# Patient Record
Sex: Male | Born: 1990 | Hispanic: No | Marital: Married | Smoking: Never smoker
Health system: Southern US, Community
[De-identification: ages and names within clinical notes are randomized; demographics above are authoritative.]

---

## 2020-03-13 ENCOUNTER — Other Ambulatory Visit: Payer: Self-pay

## 2020-03-13 ENCOUNTER — Encounter: Payer: Self-pay | Admitting: Emergency Medicine

## 2020-03-13 ENCOUNTER — Emergency Department
Admission: EM | Admit: 2020-03-13 | Discharge: 2020-03-13 | Disposition: A | Discharge status: Home / Self Care | Payer: Medicaid Other | Attending: Emergency Medicine | Admitting: Emergency Medicine

## 2020-03-13 ENCOUNTER — Emergency Department: Payer: Medicaid Other

## 2020-03-13 DIAGNOSIS — Z1152 Encounter for screening for COVID-19: Secondary | ICD-10-CM

## 2020-03-13 DIAGNOSIS — R5383 Other fatigue: Secondary | ICD-10-CM | POA: Insufficient documentation

## 2020-03-13 DIAGNOSIS — Z20822 Contact with and (suspected) exposure to covid-19: Secondary | ICD-10-CM | POA: Insufficient documentation

## 2020-03-13 DIAGNOSIS — B349 Viral infection, unspecified: Secondary | ICD-10-CM | POA: Insufficient documentation

## 2020-03-13 DIAGNOSIS — J069 Acute upper respiratory infection, unspecified: Secondary | ICD-10-CM | POA: Insufficient documentation

## 2020-03-13 LAB — BASIC METABOLIC PANEL
Anion gap: 9 (ref 5–15)
BUN: 17 mg/dL (ref 6–20)
CO2: 25 mmol/L (ref 22–32)
Calcium: 9.4 mg/dL (ref 8.9–10.3)
Chloride: 104 mmol/L (ref 98–111)
Creatinine, Ser: 1.03 mg/dL (ref 0.61–1.24)
GFR calc Af Amer: >60 mL/min (ref >60)
GFR calc non Af Amer: >60 mL/min (ref >60)
Glucose, Bld: 102 mg/dL — ABNORMAL HIGH (ref 70–99)
Potassium: 4 mmol/L (ref 3.5–5.1)
Sodium: 138 mmol/L (ref 135–145)

## 2020-03-13 LAB — CBC
HCT: 44 % (ref 39.0–52.0)
Hemoglobin: 14.4 g/dL (ref 13.0–17.0)
MCH: 27.4 pg (ref 26.0–34.0)
MCHC: 32.7 g/dL (ref 30.0–36.0)
MCV: 83.8 fL (ref 80.0–100.0)
Platelets: 262 10*3/uL (ref 150–400)
RBC: 5.25 MIL/uL (ref 4.22–5.81)
RDW: 13.7 % (ref 11.5–15.5)
WBC: 7.7 10*3/uL (ref 4.0–10.5)
nRBC: 0 % (ref 0.0–0.2)

## 2020-03-13 LAB — URINALYSIS, COMPLETE (UACMP) WITH MICROSCOPIC
Bacteria, UA: NONE SEEN
Bilirubin Urine: NEGATIVE
Glucose, UA: NEGATIVE mg/dL
Hgb urine dipstick: NEGATIVE
Ketones, ur: NEGATIVE mg/dL
Leukocytes,Ua: NEGATIVE
Nitrite: NEGATIVE
Protein, ur: NEGATIVE mg/dL
Specific Gravity, Urine: 1.028 (ref 1.005–1.030)
Squamous Epithelial / HPF: NONE SEEN (ref 0–5)
pH: 6 (ref 5.0–8.0)

## 2020-03-13 LAB — SARS CORONAVIRUS 2 BY RT PCR (HOSPITAL ORDER, PERFORMED IN ~~LOC~~ HOSPITAL LAB): SARS Coronavirus 2: NEGATIVE

## 2020-03-13 NOTE — ED Provider Notes (Signed)
[facility] Emergency Department Provider Note  ____________________________________________   First MD Initiated Contact with Patient 03/13/20 1335     (approximate)  I have reviewed the triage vital signs and the nursing notes.   HISTORY  Chief Complaint Weakness and URI   HPI Frank Williams is a 29 y.o. male presents to the ED today with complaint of weakness and fatigue for approximately 1 week.  Patient states he had some rhinorrhea and nasal congestion.  He denies any cough, change in taste or smell, vomiting, diarrhea, body aches or fever.  Patient states that his legs feel weak and he has had some mild dizziness with his nasal congestion.  He denies any known exposure to Covid.  He was tested at a clinic on [facility] and the results of his Covid test were negative.  This is a clinic unknown to this provider.      History reviewed. No pertinent past medical history.  There are no problems to display for this patient.   History reviewed. No pertinent surgical history.  Prior to Admission medications   Not on File    Allergies Patient has no known allergies.  No family history on file.  Social History Social History   Tobacco Use   Smoking status: Never Smoker   Smokeless tobacco: Never Used  Substance Use Topics   Alcohol use: Not on file   Drug use: Not on file    Review of Systems Constitutional: No fever/chills Eyes: No visual changes. ENT: No sore throat.  Positive for rhinorrhea. Cardiovascular: Denies chest pain. Respiratory: Denies shortness of breath.  Negative for cough. Gastrointestinal: No abdominal pain.  No nausea, no vomiting.  No diarrhea.   Genitourinary: Negative for dysuria. Musculoskeletal: Negative for back pain. Skin: Negative for rash. Neurological: Negative for headaches, positive generalized weakness.  Negative for  numbness.  ____________________________________________   PHYSICAL  EXAM:  VITAL SIGNS: ED Triage Vitals  Enc Vitals Group     BP 03/13/20 1012 125/77     Pulse Rate 03/13/20 1012 81     Resp 03/13/20 1012 18     Temp 03/13/20 1012 98.8 F (37.1 C)     Temp Source 03/13/20 1012 Oral     SpO2 03/13/20 1012 95 %     Weight 03/13/20 1009 300 lb (136.1 kg)     Height 03/13/20 1009 6\' 3"  (1.905 m)     Head Circumference --      Peak Flow --      Pain Score 03/13/20 1009 6     Pain Loc --      Pain Edu? --      Excl. in GC? --     Constitutional: Alert and oriented. Well appearing and in no acute distress. Eyes: Conjunctivae are normal.  Head: Atraumatic. Nose: No congestion/rhinnorhea. Neck: No stridor.   Cardiovascular: Normal rate, regular rhythm. Grossly normal heart sounds.  Good peripheral circulation. Respiratory: Normal respiratory effort.  No retractions. Lungs CTAB. Gastrointestinal: Soft and nontender. No distention.   Musculoskeletal: Moves upper and lower extremities they have difficulty.  Normal gait was noted.. Neurologic:  Normal speech and language. No gross focal neurologic deficits are appreciated. No gait instability. Skin:  Skin is warm, dry and intact. No rash noted. Psychiatric: Mood and affect are normal. Speech and behavior are normal.  ____________________________________________   LABS (all labs ordered are listed, but only abnormal results are displayed)  Labs Reviewed  BASIC METABOLIC PANEL - Abnormal;  Notable for the following components:      Result Value   Glucose, Bld 102 (*)    All other components within normal limits  URINALYSIS, COMPLETE (UACMP) WITH MICROSCOPIC - Abnormal; Notable for the following components:   Color, Urine YELLOW (*)    APPearance CLEAR (*)    All other components within normal limits  SARS CORONAVIRUS 2 BY RT PCR (HOSPITAL ORDER, PERFORMED IN [facility])  CBC    RADIOLOGY   Official radiology report(s): DG Chest 2 View  Result Date: 03/13/2020 CLINICAL DATA:   Chest tightness.  Weakness and fatigue 1 week ago. EXAM: CHEST - 2 VIEW COMPARISON:  None. FINDINGS: The heart size and mediastinal contours are within normal limits. Both lungs are clear. The visualized skeletal structures are unremarkable. IMPRESSION: No active cardiopulmonary disease. Electronically Signed   By: Signa Kell M.D.   On: 03/13/2020 10:38    ____________________________________________   PROCEDURES  Procedure(s) performed (including Critical Care):  Procedures   ____________________________________________   INITIAL IMPRESSION / ASSESSMENT AND PLAN / ED COURSE  As part of my medical decision making, I reviewed the following data within the electronic MEDICAL RECORD NUMBER Notes from prior ED visits and [place] Controlled Substance Database  Frank Williams was evaluated in Emergency Department on 03/13/2020 for the symptoms described in the history of present illness. He was evaluated in the context of the global COVID-19 pandemic, which necessitated consideration that the patient might be at risk for infection with the SARS-CoV-2 virus that causes COVID-19. Institutional protocols and algorithms that pertain to the evaluation of patients at risk for COVID-19 are in a state of rapid change based on information released by regulatory bodies including the CDC and federal and state organizations. These policies and algorithms were followed during the patient's care in the ED.  29 year old male presents to the ED with complaint of feeling weak and fatigued for approximately 1 week.  He also has developed some rhinorrhea.  He is unaware of any known exposure to Covid and states that he had a test done at a clinic on [facility] which was negative.  Patient was made aware that his labs and chest x-ray were unremarkable.  Covid test was repeated in the emergency department as we are uncertain as to what type of Covid test was done and patient does have some minor symptoms that has been seen  with Covid.  Patient is aware that until he gets the test results he is to quarantine.  He is encouraged to increase fluids and take Tylenol or ibuprofen as needed.  He is to follow-up with his PCP if any continued problems or return to the emergency department if any severe worsening of his symptoms.  ____________________________________________   FINAL CLINICAL IMPRESSION(S) / ED DIAGNOSES  Final diagnoses:  Viral illness  Encounter for screening for COVID-19     ED Discharge Orders    None       Note:  This document was prepared using Dragon voice recognition software and may include unintentional dictation errors.    Tommi Rumps, PA-C 03/13/20 1506    Sharyn Creamer, MD 03/13/20 1535

## 2020-03-13 NOTE — Discharge Instructions (Signed)
Follow-up with your primary care provider if any continued problems.  Return to the emergency department if any severe worsening of your symptoms such as shortness of breath or difficulty breathing.  A Covid test was done today and can be seen in "my chart".  Increase fluids.  Take ibuprofen or Tylenol as needed for body aches, fever, headache as needed.  If your test is positive you will need to quarantine and also family members will need to be tested if they are having symptoms.

## 2020-03-13 NOTE — ED Triage Notes (Signed)
States he developed weakness and fatigue about 1 week ago  Then has had runny nose

## 2021-10-02 IMAGING — CR DG CHEST 2V
1 series · 2 of 2 positions shown · non-contrast
Comparison: None.

CLINICAL DATA: Chest tightness.  Weakness and fatigue 1 week ago.

EXAM:
CHEST - 2 VIEW

[Series 1: dg chest 2 view · 0.14mm/px · 2 of 2 slices shown]
[im 1/2]
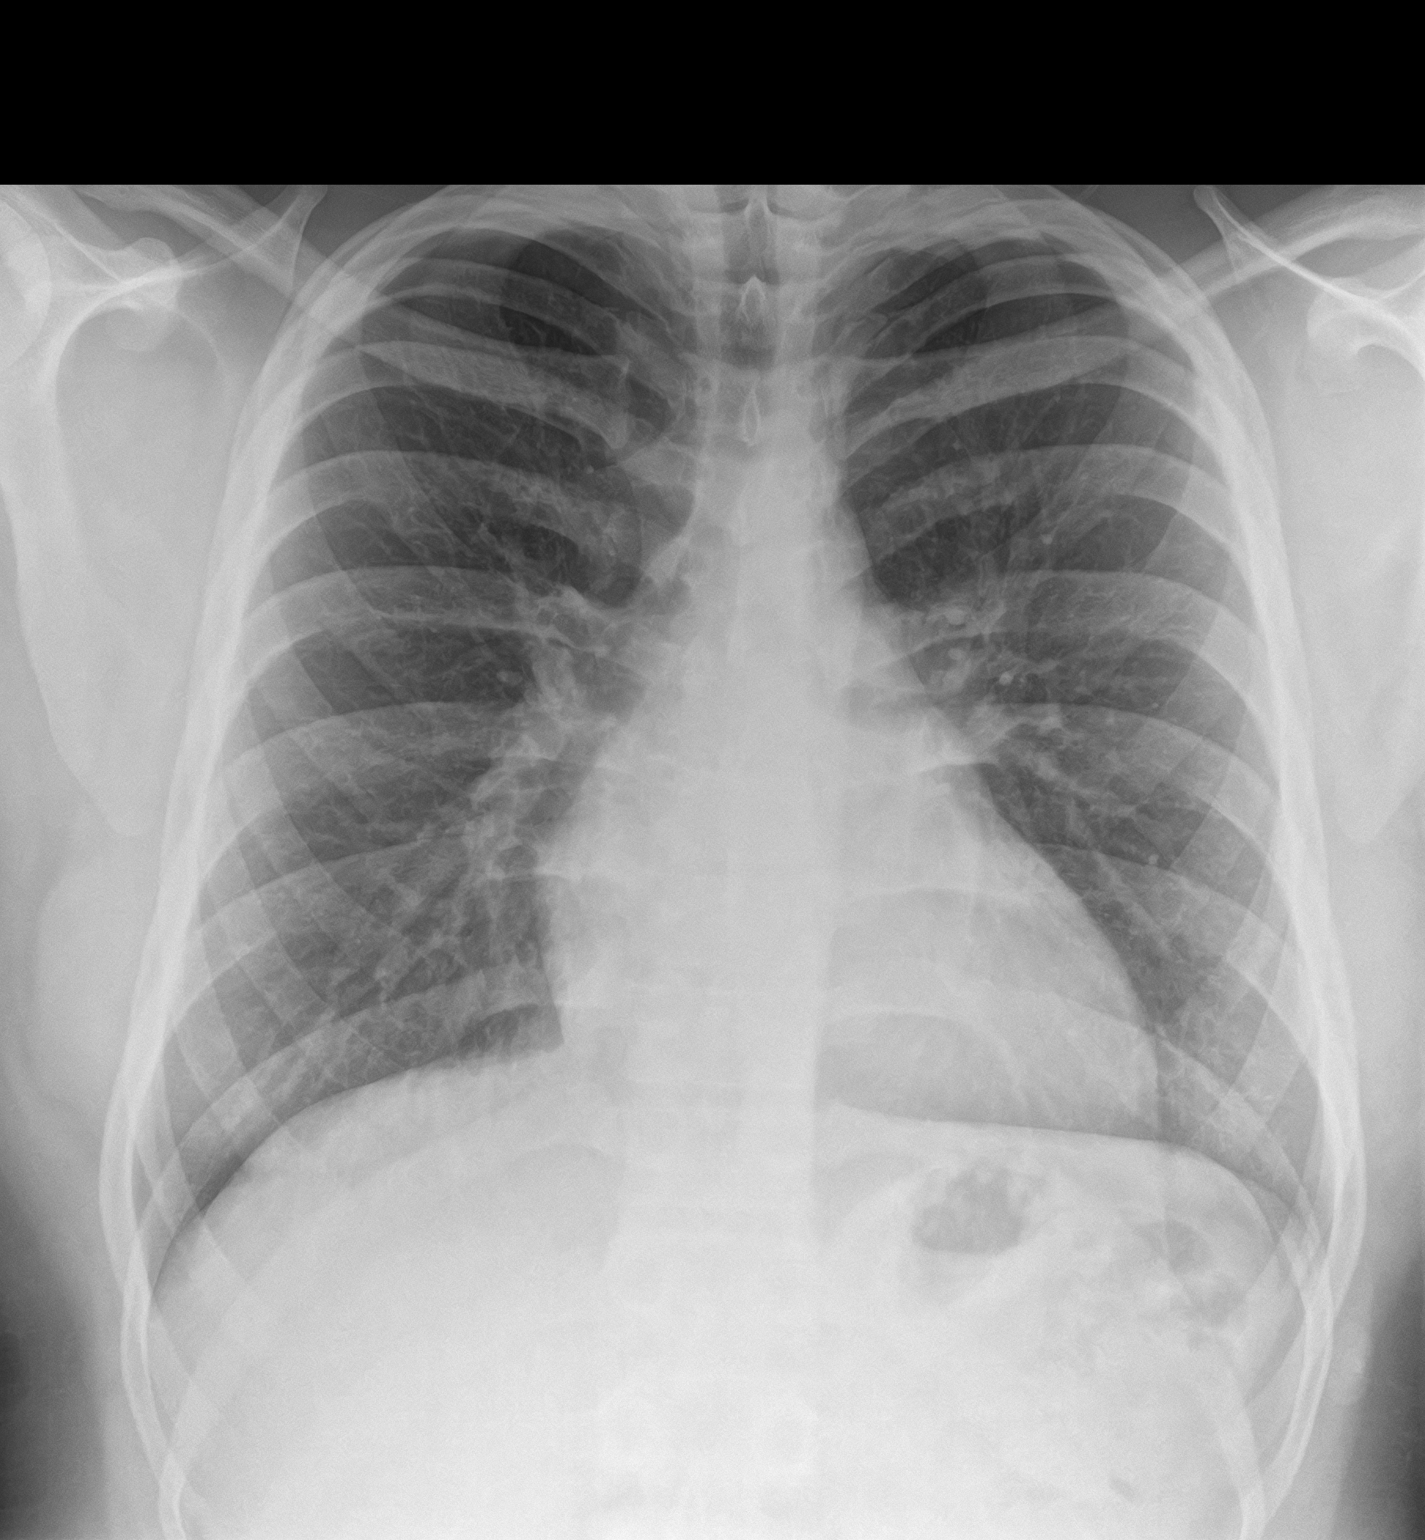
[im 2/2]
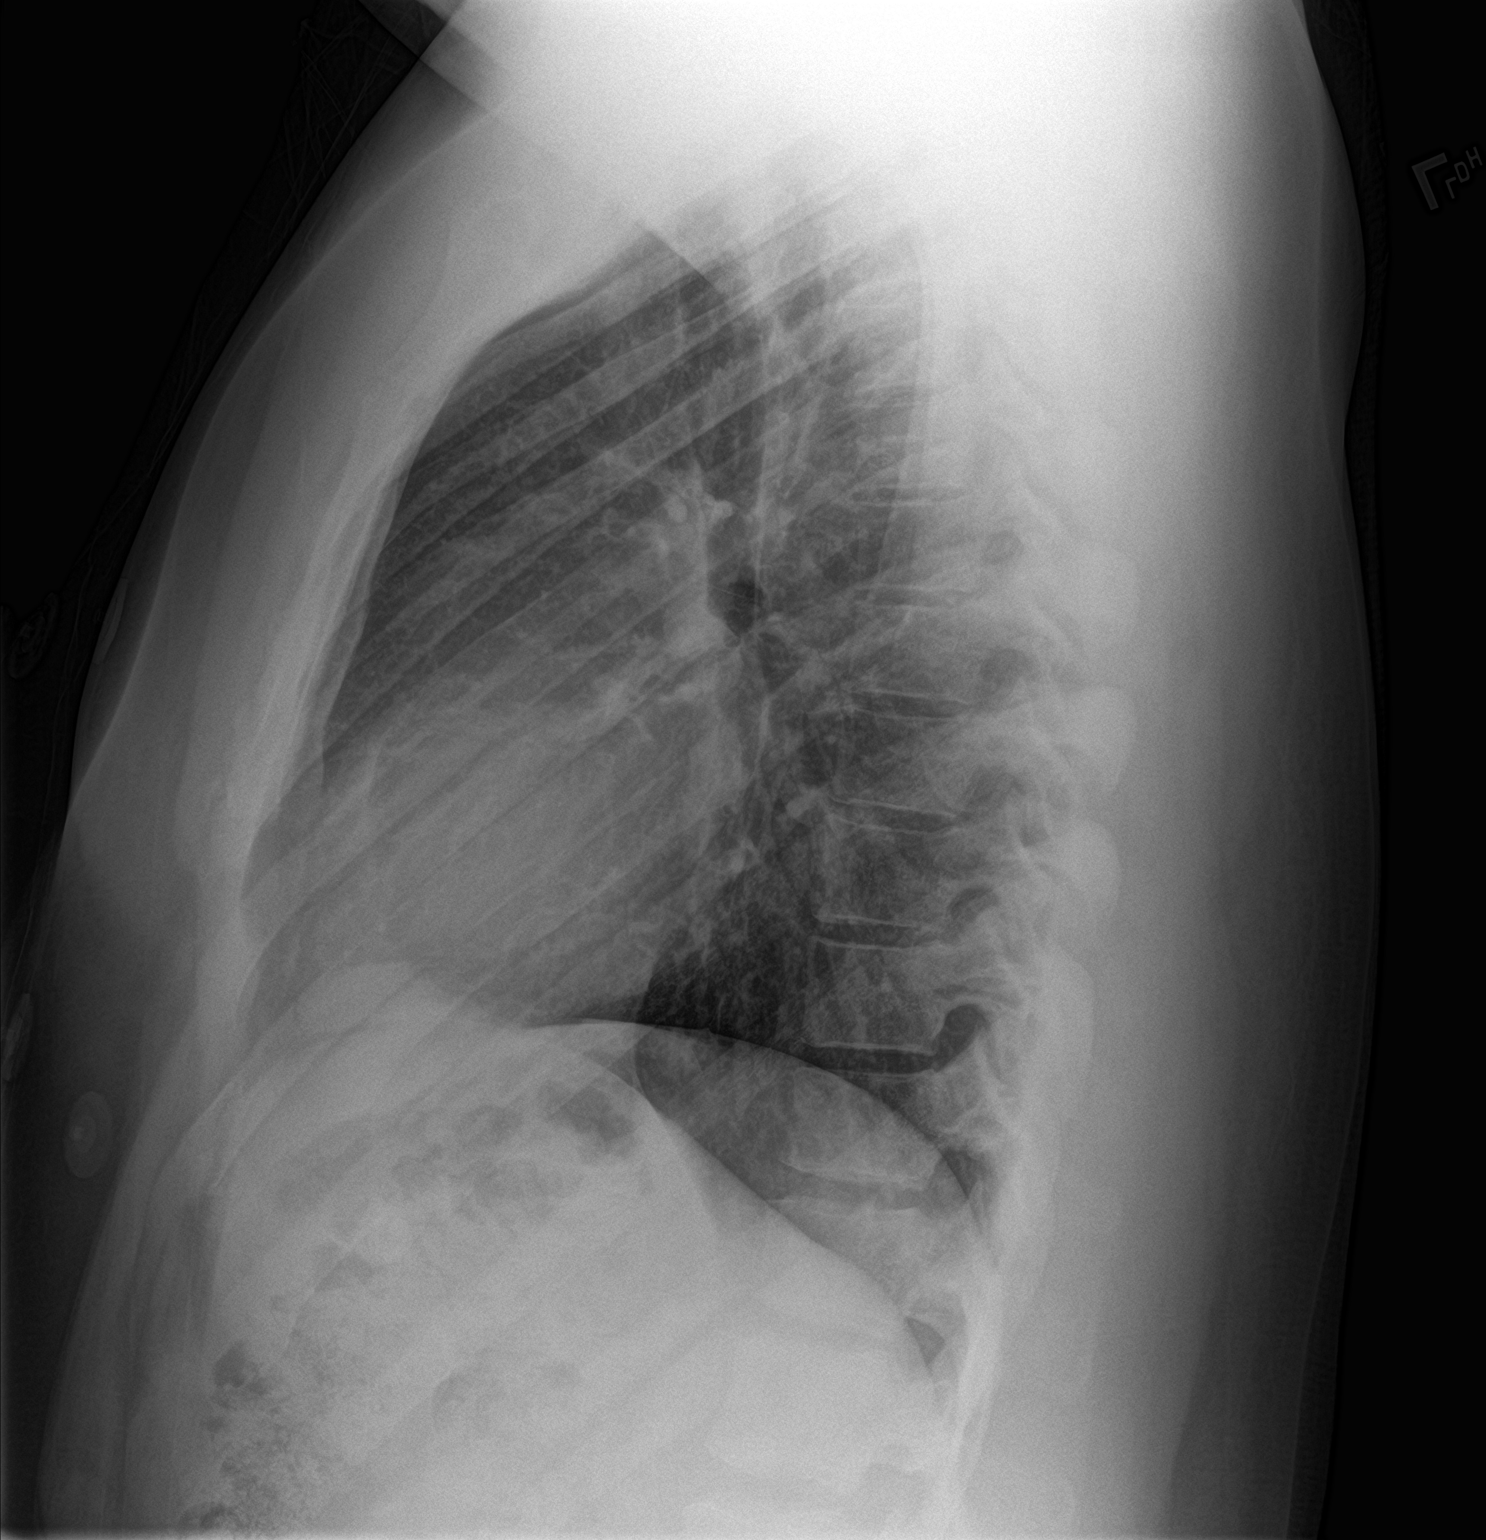

[2 of 2 positions shown; findings below may reference images not displayed]

FINDINGS: The heart size and mediastinal contours are within normal limits.
Both lungs are clear. The visualized skeletal structures are
unremarkable.
IMPRESSION: No active cardiopulmonary disease.

## 2022-05-05 ENCOUNTER — Emergency Department
Admission: EM | Admit: 2022-05-05 | Discharge: 2022-05-05 | Disposition: A | Discharge status: Home / Self Care | Payer: Medicaid Other | Attending: Emergency Medicine | Admitting: Emergency Medicine

## 2022-05-05 ENCOUNTER — Emergency Department: Payer: Medicaid Other

## 2022-05-05 ENCOUNTER — Other Ambulatory Visit: Payer: Self-pay

## 2022-05-05 DIAGNOSIS — J40 Bronchitis, not specified as acute or chronic: Secondary | ICD-10-CM | POA: Insufficient documentation

## 2022-05-05 MED ORDER — IPRATROPIUM-ALBUTEROL 0.5-2.5 (3) MG/3ML IN SOLN
3.0000 mL | Freq: Once | RESPIRATORY_TRACT | Status: AC
  Administered 2022-05-05: 3 mL via RESPIRATORY_TRACT
  Filled 2022-05-05: qty 3

## 2022-05-05 MED ORDER — ALBUTEROL SULFATE HFA 108 (90 BASE) MCG/ACT IN AERS: 2.0000 | INHALATION_SPRAY | Freq: Four times a day (QID) | RESPIRATORY_TRACT | 2 refills | Status: AC | PRN

## 2022-05-05 MED ORDER — PREDNISONE 50 MG PO TABS: 50.0000 mg | ORAL_TABLET | Freq: Every day | ORAL | 0 refills | Status: AC

## 2022-05-05 MED ORDER — PREDNISONE 20 MG PO TABS
60.0000 mg | ORAL_TABLET | Freq: Once | ORAL | Status: AC
  Administered 2022-05-05: 60 mg via ORAL
  Filled 2022-05-05: qty 3

## 2022-05-05 NOTE — ED Provider Notes (Signed)
[facility] Provider Note    Event Date/Time   First MD Initiated Contact with Patient 05/05/22 [phone]     (approximate)   History   Cough   HPI  Frank Williams is a 31 y.o. male who presents with complaints of cough, some wheezing and mild shortness of breath.  He reports the cough has been present for about 2 weeks, wheezing start about a week ago.  No history of asthma.  Does not smoke.  No fevers reported.     Physical Exam   Triage Vital Signs: ED Triage Vitals  Enc Vitals Group     BP 05/05/22 0925 (!) 112/92     Pulse Rate 05/05/22 0925 92     Resp 05/05/22 0925 19     Temp 05/05/22 0925 98 F (36.7 C)     Temp Source 05/05/22 1032 Oral     SpO2 05/05/22 0925 95 %     Weight --      Height --      Head Circumference --      Peak Flow --      Pain Score 05/05/22 0924 4     Pain Loc --      Pain Edu? --      Excl. in GC? --     Most recent vital signs: Vitals:   05/05/22 0925 05/05/22 1032  BP: (!) 112/92 128/84  Pulse: 92 83  Resp: 19 19  Temp: 98 F (36.7 C) 98.3 F (36.8 C)  SpO2: 95% 99%     General: Awake, no distress.  CV:  Good peripheral perfusion.  Resp:  Normal effort.  Scattered wheezes Abd:  No distention.  Other:     ED Results / Procedures / Treatments   Labs (all labs ordered are listed, but only abnormal results are displayed) Labs Reviewed - No data to display   EKG     RADIOLOGY Chest x-ray viewed interpreted by me, no pneumonia    PROCEDURES:  Critical Care performed:   Procedures   MEDICATIONS ORDERED IN ED: Medications  predniSONE (DELTASONE) tablet 60 mg (60 mg Oral Given 05/05/22 0952)  ipratropium-albuterol (DUONEB) 0.5-2.5 (3) MG/3ML nebulizer solution 3 mL (3 mLs Nebulization Given 05/05/22 0953)  ipratropium-albuterol (DUONEB) 0.5-2.5 (3) MG/3ML nebulizer solution 3 mL (3 mLs Nebulization Given 05/05/22 0953)     IMPRESSION / MDM / ASSESSMENT AND PLAN / ED COURSE  I  reviewed the triage vital signs and the nursing notes. Patient's presentation is most consistent with acute illness / injury with system symptoms.   Patient presents with cough, with wheezing, some shortness of breath.  Differential includes bronchitis with bronchospasm, pneumonia, upper respiratory infection  Chest x-ray is negative for pneumonia  No wheezing on exam patient treated with DuoNebs, p.o. prednisone with significant improvement.  We will treat as an outpatient with prednisone p.o. burst and Ventolin inhaler, return precautions discussed.       FINAL CLINICAL IMPRESSION(S) / ED DIAGNOSES   Final diagnoses:  Bronchitis     Rx / DC Orders   ED Discharge Orders          Ordered    predniSONE (DELTASONE) 50 MG tablet  Daily with breakfast        05/05/22 1016    albuterol (VENTOLIN HFA) 108 (90 Base) MCG/ACT inhaler  Every 6 hours PRN        05/05/22 1016  Note:  This document was prepared using Dragon voice recognition software and may include unintentional dictation errors.   Lavonia Drafts, MD 05/05/22 1053

## 2022-05-05 NOTE — ED Triage Notes (Signed)
Pt comes with c/o cough, and some yellow sputum. Pt states this has been going on for awhile.
